# Patient Record
Sex: Male | Born: 1980 | Race: Black or African American | Hispanic: No | Marital: Married | Smoking: Former smoker
Health system: Southern US, Community
[De-identification: ages and names within clinical notes are randomized; demographics above are authoritative.]

---

## 2004-10-01 ENCOUNTER — Ambulatory Visit: Payer: Self-pay | Admitting: Internal Medicine

## 2004-10-02 ENCOUNTER — Ambulatory Visit: Payer: Self-pay

## 2004-10-15 ENCOUNTER — Ambulatory Visit: Payer: Self-pay | Admitting: Internal Medicine

## 2005-08-12 ENCOUNTER — Ambulatory Visit: Payer: Self-pay | Admitting: Internal Medicine

## 2005-12-10 ENCOUNTER — Ambulatory Visit: Payer: Self-pay | Admitting: Internal Medicine

## 2005-12-30 ENCOUNTER — Ambulatory Visit: Payer: Self-pay | Admitting: Internal Medicine

## 2005-12-31 ENCOUNTER — Ambulatory Visit: Payer: Self-pay

## 2005-12-31 ENCOUNTER — Encounter: Payer: Self-pay | Admitting: Cardiology

## 2006-01-05 ENCOUNTER — Emergency Department (HOSPITAL_COMMUNITY): Admission: EM | Admit: 2006-01-05 | Discharge: 2006-01-06 | Payer: Self-pay | Admitting: Emergency Medicine

## 2008-04-02 ENCOUNTER — Ambulatory Visit: Payer: Self-pay | Admitting: Internal Medicine

## 2008-04-02 LAB — CONVERTED CEMR LAB
AST: 26 units/L (ref 0–37)
Basophils Absolute: 0 10*3/uL (ref 0.0–0.1)
CO2: 32 meq/L (ref 19–32)
Calcium: 9.4 mg/dL (ref 8.4–10.5)
Chloride: 101 meq/L (ref 96–112)
Cholesterol: 193 mg/dL (ref 0–200)
Creatinine, Ser: 1.4 mg/dL (ref 0.4–1.5)
Eosinophils Absolute: 0.1 10*3/uL (ref 0.0–0.7)
GFR calc Af Amer: 78 mL/min
GFR calc non Af Amer: 65 mL/min
HDL: 44.6 mg/dL (ref >39.0)
MCHC: 34.5 g/dL (ref 30.0–36.0)
Monocytes Absolute: 0.4 10*3/uL (ref 0.1–1.0)
Potassium: 4.4 meq/L (ref 3.5–5.1)
RBC: 5.54 M/uL (ref 4.22–5.81)
TSH: 2.05 microintl units/mL (ref 0.35–5.50)
Total Bilirubin: 1 mg/dL (ref 0.3–1.2)
Total Protein: 6.8 g/dL (ref 6.0–8.3)
Triglycerides: 88 mg/dL (ref 0–149)
VLDL: 18 mg/dL (ref 0–40)
WBC: 4.8 10*3/uL (ref 4.5–10.5)

## 2008-04-09 ENCOUNTER — Ambulatory Visit: Payer: Self-pay | Admitting: Internal Medicine

## 2008-04-10 DIAGNOSIS — R030 Elevated blood-pressure reading, without diagnosis of hypertension: Secondary | ICD-10-CM | POA: Insufficient documentation

## 2011-01-29 ENCOUNTER — Encounter: Payer: Self-pay | Admitting: Internal Medicine

## 2011-01-29 ENCOUNTER — Ambulatory Visit (INDEPENDENT_AMBULATORY_CARE_PROVIDER_SITE_OTHER): Payer: 59 | Admitting: Internal Medicine

## 2011-01-29 DIAGNOSIS — Z0000 Encounter for general adult medical examination without abnormal findings: Secondary | ICD-10-CM

## 2011-02-05 NOTE — Assessment & Plan Note (Signed)
Summary: TO RE EST/[facility]/SS   Vital Signs:  Patient profile:   30 year old male Height:      77.5 inches Weight:      205.50 pounds BMI:     24.14 O2 Sat:      96 % on Room air Temp:     98.2 degrees F oral Pulse rate:   83 / minute Resp:     16 per minute BP sitting:   128 / 70  (right arm) Cuff size:   large  Vitals Entered By: Glendell Docker CMA (January 29, 2011 1:34 PM)  O2 Flow:  Room air CC: follow-up visit Is Patient Diabetic? No Pain Assessment Patient in pain? no      Comments no concerns   Primary Care Provider:  Dondra Spry DO  CC:  follow-up visit.  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  he stays fairly active lift weights and run 2-3 x per week (2-3 miles) no sob or chest pain He has some difficulty in gaining weight  prehypertension pt has been intermittently monitoring BP at local pharm Self-reported readings range from high 120s to low 130s systolic  his diet is fairly healthy He drinks one cup of coffee per day  Preventive Screening-Counseling & Management  Alcohol-Tobacco     Alcohol drinks/day: 0     Smoking Status: never  Caffeine-Diet-Exercise     Caffeine use/day: 1 beverage daily     Does Patient Exercise: yes     Times/week: 4  Allergies (verified): No Known Drug Allergies  Past History:  Past Medical History: History of atypical chest pain Prehypertension  Normal EF 55-60% 2/07  Family History: No family history of premature CAD parents are a & w no colon ca MGF - CAD late in life no prostate cancer  Social History: Married   (wife Software engineer)- 24 year old son daughter 8 weeks 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   Head:  normocephalic and atraumatic.   Eyes:  pupils equal, pupils round, and pupils reactive to light.   Ears:  R ear normal and L ear normal.   Mouth:  good dentition and pharynx pink and moist.   Neck:  No deformities, masses, or tenderness noted. Lungs:  normal respiratory effort, normal breath sounds, no crackles, and no wheezes.   Heart:  normal rate, regular rhythm, no murmur, and no gallop.   Abdomen:  soft, non-tender, normal bowel sounds, no masses, no hepatomegaly, and no splenomegaly.   Pulses:  dorsalis pedis and posterior tibial pulses are full and equal bilaterally Extremities:  No lower extremity edema  Neurologic:  cranial nerves II-XII intact and gait normal.   Psych:  normally interactive, good eye contact, not anxious appearing, and not depressed appearing.     Impression & Recommendations:  Problem # 1:  HEALTH MAINTENANCE EXAM (ICD-V70.0)  Reviewed adult health maintenance protocols. patient  advised to continue regular exercise Continue self-monitoring blood pressure Continue self testicular exam Repeat fasting lipid profile, BMET reviewed healthy eating habits Avoid foods containing refined sugars   Td Booster: Historical (02/14/2008)   Chol: 193 (04/02/2008)   HDL: 44.6 (04/02/2008)   LDL: 131 (04/02/2008)   TG: 88 (04/02/2008) TSH: 2.05 (04/02/2008)     Orders: T-Basic Metabolic Panel [phone]) T-Lipid Profile [id number])  Patient Instructions: 1)  Please schedule a follow-up appointment as needed.   Orders Added: 1)  T-Basic Metabolic Panel [[id number]] 2)  T-Lipid Profile [[id number]] 3)  Est.  Patient 18-39 years [[id number]]   Immunization History:  Tetanus/Td Immunization History:    Tetanus/Td:  historical (02/14/2008)   Immunization History:  Tetanus/Td Immunization History:    Tetanus/Td:  Historical (02/14/2008)   Current Allergies (reviewed today): No known allergies

## 2015-03-13 ENCOUNTER — Encounter (HOSPITAL_COMMUNITY): Payer: Self-pay | Admitting: Emergency Medicine

## 2015-03-13 ENCOUNTER — Emergency Department (INDEPENDENT_AMBULATORY_CARE_PROVIDER_SITE_OTHER)
Admission: EM | Admit: 2015-03-13 | Discharge: 2015-03-13 | Disposition: A | Discharge status: Home / Self Care | Payer: Worker's Compensation | Source: Home / Self Care | Attending: Emergency Medicine | Admitting: Emergency Medicine

## 2015-03-13 DIAGNOSIS — T148 Other injury of unspecified body region: Secondary | ICD-10-CM

## 2015-03-13 DIAGNOSIS — W5581XA Bitten by other mammals, initial encounter: Secondary | ICD-10-CM

## 2015-03-13 DIAGNOSIS — S61412A Laceration without foreign body of left hand, initial encounter: Secondary | ICD-10-CM

## 2015-03-13 DIAGNOSIS — T148XXA Other injury of unspecified body region, initial encounter: Secondary | ICD-10-CM

## 2015-03-13 MED ORDER — AMOXICILLIN-POT CLAVULANATE 875-125 MG PO TABS: 1.0000 | ORAL_TABLET | Freq: Two times a day (BID) | ORAL | Status: DC

## 2015-03-13 NOTE — Discharge Instructions (Signed)
Laceration Care, Adult °A laceration is a cut that goes through all layers of the skin. The cut goes into the tissue beneath the skin. °HOME CARE °For skin adhesive strips: °· Keep the cut clean and dry. °· Do not get the strips wet. You may take a bath, but be careful to keep the cut dry. °· If the cut gets wet, pat it dry with a clean towel. °· The strips will fall off on their own. Do not remove the strips that are still stuck to the cut. °You may need a tetanus shot if: °· You cannot remember when you had your last tetanus shot. °· You have never had a tetanus shot. °If you need a tetanus shot and you choose not to have one, you may get tetanus. Sickness from tetanus can be serious. °GET HELP RIGHT AWAY IF:  °· Your pain does not get better with medicine. °· Your arm, hand, leg, or foot loses feeling (numbness) or changes color. °· Your cut is bleeding. °· Your joint feels weak, or you cannot use your joint. °· You have painful lumps on your body. °· Your cut is red, puffy (swollen), or painful. °· You have a red line on the skin near the cut. °· You have yellowish-white fluid (pus) coming from the cut. °· You have a fever. °· You have a bad smell coming from the cut or bandage. °· Your cut breaks open before or after stitches are removed. °· You notice something coming out of the cut, such as wood or glass. °· You cannot move a finger or toe. °MAKE SURE YOU:  °· Understand these instructions. °· Will watch your condition. °· Will get help right away if you are not doing well or get worse. °Document Released: 05/04/2008 Document Revised: 02/08/2012 Document Reviewed: 05/12/2011 °ExitCare® Patient Information ©2015 [employer]. This information is not intended to replace advice given to you by your health care provider. Make sure you discuss any questions you have with your health care provider. ° °

## 2015-03-13 NOTE — ED Provider Notes (Signed)
CSN: [id number]     Arrival date & time 03/13/15  1853 History   First MD Initiated Contact with Patient 03/13/15 1944     Chief Complaint  Patient presents with  . Animal Bite   (Consider location/radiation/quality/duration/timing/severity/associated sxs/prior Treatment) HPI He is a 34 year old man here for evaluation of animal bite. He works for [place] [employer] and was training and new dog. The dog accidentally bit him on his left hand in the first web space. He immediately washed it out with hydrogen peroxide. He has washed it several times today with soap and water. The bite occurred around noon. He denies any pain. The dog is up-to-date on its immunizations.  History reviewed. No pertinent past medical history. History reviewed. No pertinent past surgical history. History reviewed. No pertinent family history. History  Substance Use Topics  . Smoking status: Never Smoker   . Smokeless tobacco: Never Used  . Alcohol Use: No    Review of Systems As in history of present illness Allergies  Iodine  Home Medications   Prior to Admission medications   Medication Sig Start Date End Date Taking? Authorizing Provider  amoxicillin-clavulanate (AUGMENTIN) 875-125 MG per tablet Take 1 tablet by mouth 2 (two) times daily. 03/13/15   Charm RingsErin J Jguadalupe Opiela, MD   BP 114/62 mmHg  Pulse 60  Temp(Src) 97.9 F (36.6 C) (Oral)  Resp 18  SpO2 99% Physical Exam  Constitutional: He is oriented to person, place, and time. He appears well-developed and well-nourished. No distress.  Cardiovascular: Normal rate.   Pulmonary/Chest: Effort normal.  Neurological: He is alert and oriented to person, place, and time.  Skin:  Skin flap type laceration to left first web space.    ED Course  LACERATION REPAIR Date/Time: 03/13/2015 8:42 PM Performed by: Charm RingsHONIG, Kameka Whan J Authorized by: Charm RingsHONIG, Jansel Vonstein J Consent: Verbal consent obtained. Risks and benefits: risks, benefits and alternatives were  discussed Consent given by: patient Patient understanding: patient states understanding of the procedure being performed Patient identity confirmed: verbally with patient Time out: Immediately prior to procedure a "time out" was called to verify the correct patient, procedure, equipment, support staff and site/side marked as required. Body area: upper extremity Location details: left hand Laceration length: 1 cm Tendon involvement: none Nerve involvement: none Vascular damage: no Wound skin closure material used: Steri-Strips. Approximation: loose Approximation difficulty: simple Dressing: 4x4 sterile gauze Patient tolerance: Patient tolerated the procedure well with no immediate complications Comments: Patient placed in thumb spica splint to prevent wound from reopening.   (including critical care time) Labs Review Labs Reviewed - No data to display  Imaging Review No results found.   MDM   1. Laceration of hand, left, initial encounter   2. Animal bite    Laceration loosely approximated with Steri-Strips. Thumb spica splint placed to prevent wound from reopening. Given that this is an animal bite, will treat with Augmentin. Follow-up as needed.    Charm RingsErin J Dominie Benedick, MD 03/13/15 2043

## 2015-03-13 NOTE — ED Notes (Signed)
Pt was training a dog at work when the dog accidentally bit him on the hand.  The hand was cleaned with hydrogen peroxide and has been flushed with water several times throughout the day.  Pt states the dog is fully immunized and he is not having any real discomfort from the bite.

## 2017-02-23 ENCOUNTER — Emergency Department (HOSPITAL_BASED_OUTPATIENT_CLINIC_OR_DEPARTMENT_OTHER)
Admission: EM | Admit: 2017-02-23 | Discharge: 2017-02-23 | Disposition: A | Discharge status: Home / Self Care | Payer: Worker's Compensation | Attending: Physician Assistant | Admitting: Physician Assistant

## 2017-02-23 ENCOUNTER — Encounter (HOSPITAL_BASED_OUTPATIENT_CLINIC_OR_DEPARTMENT_OTHER): Payer: Self-pay

## 2017-02-23 DIAGNOSIS — Z0000 Encounter for general adult medical examination without abnormal findings: Secondary | ICD-10-CM | POA: Diagnosis not present

## 2017-02-23 DIAGNOSIS — Z79899 Other long term (current) drug therapy: Secondary | ICD-10-CM | POA: Diagnosis not present

## 2017-02-23 LAB — ETHANOL: Alcohol, Ethyl (B): <5 mg/dL (ref <5)

## 2017-02-23 LAB — RAPID URINE DRUG SCREEN, HOSP PERFORMED
AMPHETAMINES: NOT DETECTED
Barbiturates: NOT DETECTED
Benzodiazepines: NOT DETECTED
Cocaine: NOT DETECTED
Opiates: NOT DETECTED
TETRAHYDROCANNABINOL: NOT DETECTED

## 2017-02-23 NOTE — ED Notes (Signed)
No acute distress noted.  Pt alert, calm and waiting in room.  Visitor at bedside.
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
## 2017-02-23 NOTE — Discharge Instructions (Signed)
You appeared very well today. We have given you information on  post traumatic Stress disorder. You may want to discuss today's event with Psychologist or psychiatrist as well as with your support system.

## 2018-02-16 DIAGNOSIS — Z0000 Encounter for general adult medical examination without abnormal findings: Secondary | ICD-10-CM | POA: Diagnosis not present

## 2018-02-16 DIAGNOSIS — Z131 Encounter for screening for diabetes mellitus: Secondary | ICD-10-CM | POA: Diagnosis not present

## 2018-02-16 DIAGNOSIS — Z13220 Encounter for screening for lipoid disorders: Secondary | ICD-10-CM | POA: Diagnosis not present

## 2019-04-26 ENCOUNTER — Encounter (HOSPITAL_COMMUNITY): Payer: Self-pay

## 2019-04-26 ENCOUNTER — Other Ambulatory Visit: Payer: Self-pay

## 2019-04-26 ENCOUNTER — Emergency Department (HOSPITAL_COMMUNITY)
Admission: EM | Admit: 2019-04-26 | Discharge: 2019-04-26 | Disposition: A | Discharge status: Home / Self Care | Payer: No Typology Code available for payment source | Attending: Emergency Medicine | Admitting: Emergency Medicine

## 2019-04-26 DIAGNOSIS — Y999 Unspecified external cause status: Secondary | ICD-10-CM | POA: Diagnosis not present

## 2019-04-26 DIAGNOSIS — S0101XA Laceration without foreign body of scalp, initial encounter: Secondary | ICD-10-CM

## 2019-04-26 DIAGNOSIS — Y92410 Unspecified street and highway as the place of occurrence of the external cause: Secondary | ICD-10-CM | POA: Diagnosis not present

## 2019-04-26 DIAGNOSIS — S70311A Abrasion, right thigh, initial encounter: Secondary | ICD-10-CM | POA: Diagnosis not present

## 2019-04-26 DIAGNOSIS — Y9389 Activity, other specified: Secondary | ICD-10-CM | POA: Insufficient documentation

## 2019-04-26 DIAGNOSIS — S0990XA Unspecified injury of head, initial encounter: Secondary | ICD-10-CM | POA: Diagnosis present

## 2019-04-26 MED ORDER — IBUPROFEN 800 MG PO TABS: 800.0000 mg | ORAL_TABLET | Freq: Three times a day (TID) | ORAL | 0 refills | Status: DC
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
## 2020-04-26 ENCOUNTER — Ambulatory Visit: Payer: 59 | Admitting: Family Medicine

## 2020-04-26 ENCOUNTER — Other Ambulatory Visit: Payer: Self-pay

## 2020-04-26 ENCOUNTER — Encounter: Payer: Self-pay | Admitting: Family Medicine

## 2020-04-26 VITALS — BP 98/68 | HR 69 | Temp 98.0°F | Ht 78.0 in | Wt 208.5 lb

## 2020-04-26 DIAGNOSIS — Z0000 Encounter for general adult medical examination without abnormal findings: Secondary | ICD-10-CM

## 2020-04-26 LAB — CBC WITH DIFFERENTIAL/PLATELET
Basophils Absolute: 0 10*3/uL (ref 0.0–0.1)
Basophils Relative: 0.4 % (ref 0.0–3.0)
Eosinophils Absolute: 0 10*3/uL (ref 0.0–0.7)
Eosinophils Relative: 0.8 % (ref 0.0–5.0)
HCT: 46.3 % (ref 39.0–52.0)
Hemoglobin: 15.4 g/dL (ref 13.0–17.0)
Lymphocytes Relative: 36.6 % (ref 12.0–46.0)
Lymphs Abs: 1.9 10*3/uL (ref 0.7–4.0)
MCHC: 33.3 g/dL (ref 30.0–36.0)
MCV: 85.1 fl (ref 78.0–100.0)
Monocytes Absolute: 0.5 10*3/uL (ref 0.1–1.0)
Monocytes Relative: 9 % (ref 3.0–12.0)
Neutro Abs: 2.7 10*3/uL (ref 1.4–7.7)
Neutrophils Relative %: 53.2 % (ref 43.0–77.0)
Platelets: 189 10*3/uL (ref 150.0–400.0)
RBC: 5.44 Mil/uL (ref 4.22–5.81)
RDW: 13.6 % (ref 11.5–15.5)
WBC: 5.1 10*3/uL (ref 4.0–10.5)

## 2020-04-26 LAB — HEPATIC FUNCTION PANEL
ALT: 16 U/L (ref 0–53)
AST: 18 U/L (ref 0–37)
Albumin: 5.1 g/dL (ref 3.5–5.2)
Alkaline Phosphatase: 56 U/L (ref 39–117)
Bilirubin, Direct: 0.1 mg/dL (ref 0.0–0.3)
Total Bilirubin: 0.7 mg/dL (ref 0.2–1.2)
Total Protein: 7.3 g/dL (ref 6.0–8.3)

## 2020-04-26 LAB — BASIC METABOLIC PANEL
BUN: 15 mg/dL (ref 6–23)
CO2: 31 mEq/L (ref 19–32)
Calcium: 9.8 mg/dL (ref 8.4–10.5)
Chloride: 101 mEq/L (ref 96–112)
Creatinine, Ser: 1.05 mg/dL (ref 0.40–1.50)
GFR: 95 mL/min (ref >60.00)
Glucose, Bld: 87 mg/dL (ref 70–99)
Potassium: 4.2 mEq/L (ref 3.5–5.1)
Sodium: 137 mEq/L (ref 135–145)

## 2020-04-26 LAB — LIPID PANEL
Cholesterol: 181 mg/dL (ref 0–200)
HDL: 53.7 mg/dL (ref >39.00)
LDL Cholesterol: 113 mg/dL — ABNORMAL HIGH (ref 0–99)
NonHDL: 126.94
Total CHOL/HDL Ratio: 3
Triglycerides: 72 mg/dL (ref 0.0–149.0)
VLDL: 14.4 mg/dL (ref 0.0–40.0)

## 2020-04-26 LAB — PSA: PSA: 0.37 ng/mL (ref 0.10–4.00)

## 2020-04-26 LAB — TSH: TSH: 1.57 u[IU]/mL (ref 0.35–4.50)

## 2020-04-26 NOTE — Patient Instructions (Signed)
Preventive Care 19-39 Years Old, Male Preventive care refers to lifestyle choices and visits with your health care provider that can promote health and wellness. This includes:  A yearly physical exam. This is also called an annual well check.  Regular dental and eye exams.  Immunizations.  Screening for certain conditions.  Healthy lifestyle choices, such as eating a healthy diet, getting regular exercise, not using drugs or products that contain nicotine and tobacco, and limiting alcohol use. What can I expect for my preventive care visit? Physical exam Your health care provider will check:  Height and weight. These may be used to calculate body mass index (BMI), which is a measurement that tells if you are at a healthy weight.  Heart rate and blood pressure.  Your skin for abnormal spots. Counseling Your health care provider may ask you questions about:  Alcohol, tobacco, and drug use.  Emotional well-being.  Home and relationship well-being.  Sexual activity.  Eating habits.  Work and work Statistician. What immunizations do I need?  Influenza (flu) vaccine  This is recommended every year. Tetanus, diphtheria, and pertussis (Tdap) vaccine  You may need a Td booster every 10 years. Varicella (chickenpox) vaccine  You may need this vaccine if you have not already been vaccinated. Human papillomavirus (HPV) vaccine  If recommended by your health care provider, you may need three doses over 6 months. Measles, mumps, and rubella (MMR) vaccine  You may need at least one dose of MMR. You may also need a second dose. Meningococcal conjugate (MenACWY) vaccine  One dose is recommended if you are 45-76 years old and a Market researcher living in a residence hall, or if you have one of several medical conditions. You may also need additional booster doses. Pneumococcal conjugate (PCV13) vaccine  You may need this if you have certain conditions and were not  previously vaccinated. Pneumococcal polysaccharide (PPSV23) vaccine  You may need one or two doses if you smoke cigarettes or if you have certain conditions. Hepatitis A vaccine  You may need this if you have certain conditions or if you travel or work in places where you may be exposed to hepatitis A. Hepatitis B vaccine  You may need this if you have certain conditions or if you travel or work in places where you may be exposed to hepatitis B. Haemophilus influenzae type b (Hib) vaccine  You may need this if you have certain risk factors. You may receive vaccines as individual doses or as more than one vaccine together in one shot (combination vaccines). Talk with your health care provider about the risks and benefits of combination vaccines. What tests do I need? Blood tests  Lipid and cholesterol levels. These may be checked every 5 years starting at age 17.  Hepatitis C test.  Hepatitis B test. Screening   Diabetes screening. This is done by checking your blood sugar (glucose) after you have not eaten for a while (fasting).  Sexually transmitted disease (STD) testing. Talk with your health care provider about your test results, treatment options, and if necessary, the need for more tests. Follow these instructions at home: Eating and drinking   Eat a diet that includes fresh fruits and vegetables, whole grains, lean protein, and low-fat dairy products.  Take vitamin and mineral supplements as recommended by your health care provider.  Do not drink alcohol if your health care provider tells you not to drink.  If you drink alcohol: ? Limit how much you have to 0-2  drinks a day. ? Be aware of how much alcohol is in your drink. In the [place] one drink equals one 12 oz bottle of beer (355 mL), one 5 oz glass of wine (148 mL), or one 1 oz glass of hard liquor (44 mL). Lifestyle  Take daily care of your teeth and gums.  Stay active. Exercise for at least 30 minutes on 5 or  more days each week.  Do not use any products that contain nicotine or tobacco, such as cigarettes, e-cigarettes, and chewing tobacco. If you need help quitting, ask your health care provider.  If you are sexually active, practice safe sex. Use a condom or other form of protection to prevent STIs (sexually transmitted infections). What's next?  Go to your health care provider once a year for a well check visit.  Ask your health care provider how often you should have your eyes and teeth checked.  Stay up to date on all vaccines. This information is not intended to replace advice given to you by your health care provider. Make sure you discuss any questions you have with your health care provider. Document Revised: 11/10/2018 Document Reviewed: 11/10/2018 Elsevier Patient Education  2020 [employer].

## 2020-04-26 NOTE — Progress Notes (Signed)
Subjective:     Patient ID: Charles Mcknight, male   DOB: [birth year], 39 y.o.   MRN: [id number]  HPI Charles Mcknight is seen to establish care.  He is requesting physical exam.   He is married and has 4 children.  He has 3 of his own and is also helping to raise 21 year old nephew.  He grew up in [place] in [place].  He has several siblings and several of these are half siblings.  He has been married for 14 years.  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  Non-smoker.  Only occasional alcohol use.  No chronic medical problems.  Takes no medications.  No prior surgeries.  Last tetanus 2015.  Family history is significant for mother and father having history of alcohol and drug abuse.  They are both deceased.  He stays very active with martial arts.  Exercises fairly regularly.  History reviewed. No pertinent past medical history. History reviewed. No pertinent surgical history.  reports that he has been smoking cigars. He has never used smokeless tobacco. He reports that he does not drink alcohol or use drugs. family history includes Alcohol abuse in his father and mother; Drug abuse in his father and mother; Early death in his father and mother. Allergies  Allergen Reactions  . Iodine      Review of Systems  Constitutional: Negative for activity change, appetite change, fatigue and fever.  HENT: Negative for congestion, ear pain and trouble swallowing.   Eyes: Negative for pain and visual disturbance.  Respiratory: Negative for cough, shortness of breath and wheezing.   Cardiovascular: Negative for chest pain and palpitations.  Gastrointestinal: Negative for abdominal distention, abdominal pain, blood in stool, constipation, diarrhea, nausea, rectal pain and vomiting.  Genitourinary: Negative for dysuria, hematuria and testicular pain.  Musculoskeletal: Negative for arthralgias and joint swelling.  Skin: Negative for rash.  Neurological: Negative for dizziness, syncope and headaches.   Hematological: Negative for adenopathy.  Psychiatric/Behavioral: Negative for confusion and dysphoric mood.       Objective:   Physical Exam Constitutional:      General: He is not in acute distress.    Appearance: He is well-developed.  HENT:     Head: Normocephalic and atraumatic.     Right Ear: External ear normal.     Left Ear: External ear normal.  Eyes:     Conjunctiva/sclera: Conjunctivae normal.     Pupils: Pupils are equal, round, and reactive to light.  Neck:     Thyroid: No thyromegaly.  Cardiovascular:     Rate and Rhythm: Normal rate and regular rhythm.     Heart sounds: Normal heart sounds. No murmur.  Pulmonary:     Effort: No respiratory distress.     Breath sounds: No wheezing or rales.  Abdominal:     General: Bowel sounds are normal. There is no distension.     Palpations: Abdomen is soft. There is no mass.     Tenderness: There is no abdominal tenderness. There is no guarding or rebound.  Musculoskeletal:     Cervical back: Normal range of motion and neck supple.     Right lower leg: No edema.     Left lower leg: No edema.  Lymphadenopathy:     Cervical: No cervical adenopathy.  Skin:    Findings: No rash.  Neurological:     Mental Status: He is alert and oriented to person, place, and time.     Cranial Nerves: No cranial nerve  deficit.        Assessment:     Healthy 39 year old male.  No recent screening labs.  Tetanus up-to-date.  Has received Covid vaccines already.    Plan:     -Obtain screening labs.  He is requesting PSA. -Continue regular exercise habits.  Eulas Post MD [facility] at [facility]

## 2021-12-17 ENCOUNTER — Ambulatory Visit (INDEPENDENT_AMBULATORY_CARE_PROVIDER_SITE_OTHER): Payer: 59 | Admitting: Family Medicine

## 2021-12-17 VITALS — BP 130/70 | HR 69 | Temp 98.2°F | Ht 78.0 in | Wt 194.3 lb

## 2021-12-17 DIAGNOSIS — Z0000 Encounter for general adult medical examination without abnormal findings: Secondary | ICD-10-CM

## 2021-12-17 DIAGNOSIS — Z23 Encounter for immunization: Secondary | ICD-10-CM

## 2021-12-17 LAB — LIPID PANEL
Cholesterol: 192 mg/dL (ref 0–200)
HDL: 67.3 mg/dL (ref >39.00)
LDL Cholesterol: 112 mg/dL — ABNORMAL HIGH (ref 0–99)
NonHDL: 124.35
Total CHOL/HDL Ratio: 3
Triglycerides: 62 mg/dL (ref 0.0–149.0)
VLDL: 12.4 mg/dL (ref 0.0–40.0)

## 2021-12-17 LAB — HEPATIC FUNCTION PANEL
ALT: 16 U/L (ref 0–53)
AST: 19 U/L (ref 0–37)
Albumin: 5 g/dL (ref 3.5–5.2)
Alkaline Phosphatase: 57 U/L (ref 39–117)
Bilirubin, Direct: 0.2 mg/dL (ref 0.0–0.3)
Total Bilirubin: 0.8 mg/dL (ref 0.2–1.2)
Total Protein: 7.3 g/dL (ref 6.0–8.3)

## 2021-12-17 LAB — CBC WITH DIFFERENTIAL/PLATELET
Basophils Absolute: 0 10*3/uL (ref 0.0–0.1)
Basophils Relative: 0.6 % (ref 0.0–3.0)
Eosinophils Absolute: 0.1 10*3/uL (ref 0.0–0.7)
Eosinophils Relative: 1.1 % (ref 0.0–5.0)
HCT: 47.4 % (ref 39.0–52.0)
Hemoglobin: 15.5 g/dL (ref 13.0–17.0)
Lymphocytes Relative: 35.2 % (ref 12.0–46.0)
Lymphs Abs: 2 10*3/uL (ref 0.7–4.0)
MCHC: 32.7 g/dL (ref 30.0–36.0)
MCV: 85.2 fl (ref 78.0–100.0)
Monocytes Absolute: 0.5 10*3/uL (ref 0.1–1.0)
Monocytes Relative: 8.5 % (ref 3.0–12.0)
Neutro Abs: 3.2 10*3/uL (ref 1.4–7.7)
Neutrophils Relative %: 54.6 % (ref 43.0–77.0)
Platelets: 209 10*3/uL (ref 150.0–400.0)
RBC: 5.56 Mil/uL (ref 4.22–5.81)
RDW: 13.5 % (ref 11.5–15.5)
WBC: 5.8 10*3/uL (ref 4.0–10.5)

## 2021-12-17 LAB — BASIC METABOLIC PANEL
BUN: 15 mg/dL (ref 6–23)
CO2: 31 mEq/L (ref 19–32)
Calcium: 9.8 mg/dL (ref 8.4–10.5)
Chloride: 100 mEq/L (ref 96–112)
Creatinine, Ser: 1.16 mg/dL (ref 0.40–1.50)
GFR: 78.55 mL/min (ref >60.00)
Glucose, Bld: 85 mg/dL (ref 70–99)
Potassium: 4.8 mEq/L (ref 3.5–5.1)
Sodium: 138 mEq/L (ref 135–145)

## 2021-12-17 LAB — PSA: PSA: 0.45 ng/mL (ref 0.10–4.00)

## 2021-12-17 LAB — TSH: TSH: 2.08 u[IU]/mL (ref 0.35–5.50)

## 2021-12-17 NOTE — Progress Notes (Signed)
Established Patient Office Visit  Subjective:  Patient ID: Charles Mcknight, male    DOB: May 19, 1981  Age: 41 y.o. MRN: [id number]  CC:  Chief Complaint  Patient presents with   Annual Exam    HPI Charles Mcknight presents for physical exam.  Very health-conscious.  He is exercising regularly.  He ran his first half marathon back in the fall.  Has run as much as 60 miles per week.  No regular medications.  Tries to watch diet carefully.  Health maintenance  -Has not had flu vaccine but would like to go ahead with that today -No history of hepatitis C screening but low risk -Tetanus up-to-date  Social history-he is married.  He has 3 children of his own helping raise nephew who is 15.  Patient grew up in [place].  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  Never smoked.  Rare alcohol.  Family history-both parents had alcohol and drug problems.  They are both deceased.  His father had HIV.  Not sure of their exact cause of death.  Mother he states had multiorgan failure.  No past medical history on file.  No past surgical history on file.  Family History  Problem Relation Age of Onset   Drug abuse Mother    Alcohol abuse Mother    Early death Mother    Alcohol abuse Father    Drug abuse Father    Early death Father     Social History   Socioeconomic History   Marital status: Married    Spouse name: Not on file   Number of children: Not on file   Years of education: Not on file   Highest education level: Not on file  Occupational History   Not on file  Tobacco Use   Smoking status: Some Days    Types: Cigars   Smokeless tobacco: Never   Tobacco comments:    cigar socially  Vaping Use   Vaping Use: Never used  Substance and Sexual Activity   Alcohol use: No   Drug use: No   Sexual activity: Yes  Other Topics Concern   Not on file  Social History Narrative   Not on file   Social Determinants of Health   Financial Resource Strain: Not on file  Food  Insecurity: Not on file  Transportation Needs: Not on file  Physical Activity: Not on file  Stress: Not on file  Social Connections: Not on file  Intimate Partner Violence: Not on file    No outpatient medications prior to visit.   No facility-administered medications prior to visit.    Allergies  Allergen Reactions   Iodine     ROS Review of Systems  Constitutional:  Negative for activity change, appetite change, fatigue and fever.  HENT:  Negative for congestion, ear pain and trouble swallowing.   Eyes:  Negative for pain and visual disturbance.  Respiratory:  Negative for cough, shortness of breath and wheezing.   Cardiovascular:  Negative for chest pain and palpitations.  Gastrointestinal:  Negative for abdominal distention, abdominal pain, blood in stool, constipation, diarrhea, nausea, rectal pain and vomiting.  Genitourinary:  Negative for dysuria, hematuria and testicular pain.  Musculoskeletal:  Negative for arthralgias and joint swelling.  Skin:  Negative for rash.  Neurological:  Negative for dizziness, syncope and headaches.  Hematological:  Negative for adenopathy.  Psychiatric/Behavioral:  Negative for confusion and dysphoric mood.      Objective:    Physical Exam Constitutional:  General: He is not in acute distress.    Appearance: He is well-developed.  HENT:     Head: Normocephalic and atraumatic.     Right Ear: External ear normal.     Left Ear: External ear normal.  Eyes:     Conjunctiva/sclera: Conjunctivae normal.     Pupils: Pupils are equal, round, and reactive to light.  Neck:     Thyroid: No thyromegaly.  Cardiovascular:     Rate and Rhythm: Normal rate and regular rhythm.     Heart sounds: Normal heart sounds. No murmur heard. Pulmonary:     Effort: No respiratory distress.     Breath sounds: No wheezing or rales.  Abdominal:     General: Bowel sounds are normal. There is no distension.     Palpations: Abdomen is soft. There is no  mass.     Tenderness: There is no abdominal tenderness. There is no guarding or rebound.  Musculoskeletal:     Cervical back: Normal range of motion and neck supple.     Right lower leg: No edema.     Left lower leg: No edema.  Lymphadenopathy:     Cervical: No cervical adenopathy.  Skin:    Findings: No rash.  Neurological:     Mental Status: He is alert and oriented to person, place, and time.     Cranial Nerves: No cranial nerve deficit.     Deep Tendon Reflexes: Reflexes normal.    BP 130/70 (BP Location: Left Arm, Patient Position: Sitting, Cuff Size: Normal)    Pulse 69    Temp 98.2 F (36.8 C) (Oral)    Ht 6\' 6"  (1.981 m)    Wt 194 lb 4.8 oz (88.1 kg)    SpO2 98%    BMI 22.45 kg/m  Wt Readings from Last 3 Encounters:  12/17/21 194 lb 4.8 oz (88.1 kg)  04/26/20 208 lb 8 oz (94.6 kg)  04/26/19 208 lb (94.3 kg)     Health Maintenance Due  Topic Date Due   HIV Screening  Never done   Hepatitis C Screening  Never done   COVID-19 Vaccine (3 - Booster for Pfizer series) 03/15/2020    There are no preventive care reminders to display for this patient.  Lab Results  Component Value Date   TSH 1.57 04/26/2020   Lab Results  Component Value Date   WBC 5.1 04/26/2020   HGB 15.4 04/26/2020   HCT 46.3 04/26/2020   MCV 85.1 04/26/2020   PLT 189.0 04/26/2020   Lab Results  Component Value Date   NA 137 04/26/2020   K 4.2 04/26/2020   CO2 31 04/26/2020   GLUCOSE 87 04/26/2020   BUN 15 04/26/2020   CREATININE 1.05 04/26/2020   BILITOT 0.7 04/26/2020   ALKPHOS 56 04/26/2020   AST 18 04/26/2020   ALT 16 04/26/2020   PROT 7.3 04/26/2020   ALBUMIN 5.1 04/26/2020   CALCIUM 9.8 04/26/2020   GFR 95.00 04/26/2020   Lab Results  Component Value Date   CHOL 181 04/26/2020   Lab Results  Component Value Date   HDL 53.70 04/26/2020   Lab Results  Component Value Date   LDLCALC 113 (H) 04/26/2020   Lab Results  Component Value Date   TRIG 72.0 04/26/2020   Lab  Results  Component Value Date   CHOLHDL 3 04/26/2020   No results found for: HGBA1C    Assessment & Plan:   Problem List Items Addressed This Visit  None Visit Diagnoses     Physical exam    -  Primary   Relevant Orders   Basic metabolic panel   Lipid panel   CBC with Differential/Platelet   TSH   Hepatic function panel   PSA   Hep C Antibody   Need for immunization against influenza       Relevant Orders   Flu Vaccine QUAD 28mo+IM (Fluarix, Fluzone & Alfiuria Quad PF) (Completed)     He has lost some weight recently which he attributes to increased running over the past year.  Feels well though with good appetite.  -Obtain screening labs as above.  Include hepatitis C antibody. -Flu vaccine given -Patient requesting PSA screening -Continue regular exercise habits  No orders of the defined types were placed in this encounter.   Follow-up: No follow-ups on file.    Carolann Littler, MD

## 2021-12-18 LAB — HEPATITIS C ANTIBODY
Hepatitis C Ab: NONREACTIVE
SIGNAL TO CUT-OFF: 0.07 (ref <1.00)

## 2022-03-24 ENCOUNTER — Emergency Department (HOSPITAL_BASED_OUTPATIENT_CLINIC_OR_DEPARTMENT_OTHER): Payer: No Typology Code available for payment source | Admitting: Radiology

## 2022-03-24 ENCOUNTER — Emergency Department (HOSPITAL_BASED_OUTPATIENT_CLINIC_OR_DEPARTMENT_OTHER)
Admission: EM | Admit: 2022-03-24 | Discharge: 2022-03-24 | Disposition: A | Discharge status: Home / Self Care | Payer: No Typology Code available for payment source | Attending: Emergency Medicine | Admitting: Emergency Medicine

## 2022-03-24 ENCOUNTER — Encounter (HOSPITAL_BASED_OUTPATIENT_CLINIC_OR_DEPARTMENT_OTHER): Payer: Self-pay

## 2022-03-24 ENCOUNTER — Other Ambulatory Visit: Payer: Self-pay

## 2022-03-24 DIAGNOSIS — S61401A Unspecified open wound of right hand, initial encounter: Secondary | ICD-10-CM | POA: Insufficient documentation

## 2022-03-24 DIAGNOSIS — S61402A Unspecified open wound of left hand, initial encounter: Secondary | ICD-10-CM | POA: Diagnosis not present

## 2022-03-24 DIAGNOSIS — S6991XA Unspecified injury of right wrist, hand and finger(s), initial encounter: Secondary | ICD-10-CM | POA: Diagnosis present

## 2022-03-24 DIAGNOSIS — Z21 Asymptomatic human immunodeficiency virus [HIV] infection status: Secondary | ICD-10-CM | POA: Insufficient documentation

## 2022-03-24 DIAGNOSIS — S61409A Unspecified open wound of unspecified hand, initial encounter: Secondary | ICD-10-CM

## 2022-03-24 LAB — COMPREHENSIVE METABOLIC PANEL
ALT: 13 U/L (ref 0–44)
AST: 22 U/L (ref 15–41)
Albumin: 4.7 g/dL (ref 3.5–5.0)
Alkaline Phosphatase: 51 U/L (ref 38–126)
Anion gap: 9 (ref 5–15)
BUN: 14 mg/dL (ref 6–20)
CO2: 27 mmol/L (ref 22–32)
Calcium: 10.1 mg/dL (ref 8.9–10.3)
Chloride: 101 mmol/L (ref 98–111)
Creatinine, Ser: 1.09 mg/dL (ref 0.61–1.24)
GFR, Estimated: >60 mL/min (ref >60)
Glucose, Bld: 95 mg/dL (ref 70–99)
Potassium: 3.8 mmol/L (ref 3.5–5.1)
Sodium: 137 mmol/L (ref 135–145)
Total Bilirubin: 0.6 mg/dL (ref 0.3–1.2)
Total Protein: 7.3 g/dL (ref 6.5–8.1)

## 2022-03-24 LAB — RAPID HIV SCREEN (HIV 1/2 AB+AG)
HIV 1/2 Antibodies: NONREACTIVE
HIV-1 P24 Antigen - HIV24: NONREACTIVE

## 2022-03-24 MED ORDER — ELVITEG-COBIC-EMTRICIT-TENOFAF 150-150-200-10 MG PREPACK
1.0000 | ORAL_TABLET | Freq: Once | ORAL | Status: AC
  Administered 2022-03-25: 1 via ORAL

## 2022-03-24 MED ORDER — ELVITEG-COBIC-EMTRICIT-TENOFAF 150-150-200-10 MG PO TABS: 1.0000 | ORAL_TABLET | Freq: Every day | ORAL | 0 refills | Status: DC

## 2022-03-24 MED ORDER — AMOXICILLIN-POT CLAVULANATE 875-125 MG PO TABS: 1.0000 | ORAL_TABLET | Freq: Two times a day (BID) | ORAL | 0 refills | Status: DC

## 2022-03-24 MED ORDER — AMOXICILLIN-POT CLAVULANATE 875-125 MG PO TABS
1.0000 | ORAL_TABLET | Freq: Once | ORAL | Status: AC
  Administered 2022-03-24: 1 via ORAL
  Filled 2022-03-24: qty 1
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
## 2022-03-24 NOTE — Discharge Instructions (Signed)
Follow up with your primary care provider.  I started you on a medication for postexposure prophylaxis. ? ?Return for any worsening symptoms. ?
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
## 2022-03-25 ENCOUNTER — Telehealth: Payer: Self-pay | Admitting: *Deleted

## 2022-03-25 ENCOUNTER — Other Ambulatory Visit (HOSPITAL_COMMUNITY): Payer: Self-pay

## 2022-03-25 LAB — HEPATITIS B SURFACE ANTIGEN: Hepatitis B Surface Ag: NONREACTIVE

## 2022-03-25 LAB — HEPATITIS C ANTIBODY: HCV Ab: NONREACTIVE

## 2022-03-25 NOTE — Telephone Encounter (Signed)
RNCM placed call to pt to enroll in Advancing Access program. No answer and voicemail was full. Pt is insured with $50 co-pay for Rx. ?

## 2022-03-25 NOTE — TOC Benefit Eligibility Note (Signed)
Patient Advocate Encounter ? ?Insurance verification completed.   ? ?The patient is currently admitted and upon discharge could be taking Genvoya 150-150-10 mg tablets. ? ?The current 30 day co-pay is, $50.00.  ? ?The patient is insured through [employer]  ? ? ? ?Uriah Philipson, CPhT ?Pharmacy Patient Advocate Specialist ?[facility] Patient Advocate Team ?Direct Number: [phone]  Fax: [phone] ? ? ? ? ? ?  ?

## 2022-03-25 NOTE — ED Notes (Signed)
Pt was given prescription for Genvoya to take to 24 hour pharmacy to have medication filled tonight so that he could take his first dose after discharge.  ?

## 2022-03-26 LAB — RPR: RPR Ser Ql: NONREACTIVE

## 2023-01-01 ENCOUNTER — Ambulatory Visit (INDEPENDENT_AMBULATORY_CARE_PROVIDER_SITE_OTHER): Payer: 59 | Admitting: Family Medicine

## 2023-01-01 ENCOUNTER — Encounter: Payer: Self-pay | Admitting: Family Medicine

## 2023-01-01 VITALS — BP 118/70 | HR 56 | Temp 98.4°F | Ht 76.38 in | Wt 203.0 lb

## 2023-01-01 DIAGNOSIS — Z0000 Encounter for general adult medical examination without abnormal findings: Secondary | ICD-10-CM

## 2023-01-01 LAB — BASIC METABOLIC PANEL
BUN: 14 mg/dL (ref 6–23)
CO2: 30 mEq/L (ref 19–32)
Calcium: 9.5 mg/dL (ref 8.4–10.5)
Chloride: 100 mEq/L (ref 96–112)
Creatinine, Ser: 1.26 mg/dL (ref 0.40–1.50)
GFR: 70.61 mL/min (ref >60.00)
Glucose, Bld: 90 mg/dL (ref 70–99)
Potassium: 4.9 mEq/L (ref 3.5–5.1)
Sodium: 138 mEq/L (ref 135–145)

## 2023-01-01 LAB — CBC WITH DIFFERENTIAL/PLATELET
Basophils Absolute: 0 10*3/uL (ref 0.0–0.1)
Basophils Relative: 0.5 % (ref 0.0–3.0)
Eosinophils Absolute: 0 10*3/uL (ref 0.0–0.7)
Eosinophils Relative: 0.8 % (ref 0.0–5.0)
HCT: 44.1 % (ref 39.0–52.0)
Hemoglobin: 14.8 g/dL (ref 13.0–17.0)
Lymphocytes Relative: 34 % (ref 12.0–46.0)
Lymphs Abs: 1.6 10*3/uL (ref 0.7–4.0)
MCHC: 33.7 g/dL (ref 30.0–36.0)
MCV: 85 fl (ref 78.0–100.0)
Monocytes Absolute: 0.5 10*3/uL (ref 0.1–1.0)
Monocytes Relative: 9.6 % (ref 3.0–12.0)
Neutro Abs: 2.7 10*3/uL (ref 1.4–7.7)
Neutrophils Relative %: 55.1 % (ref 43.0–77.0)
Platelets: 211 10*3/uL (ref 150.0–400.0)
RBC: 5.18 Mil/uL (ref 4.22–5.81)
RDW: 14 % (ref 11.5–15.5)
WBC: 4.8 10*3/uL (ref 4.0–10.5)

## 2023-01-01 LAB — PSA: PSA: 0.42 ng/mL (ref 0.10–4.00)

## 2023-01-01 LAB — HEPATIC FUNCTION PANEL
ALT: 22 U/L (ref 0–53)
AST: 29 U/L (ref 0–37)
Albumin: 4.7 g/dL (ref 3.5–5.2)
Alkaline Phosphatase: 62 U/L (ref 39–117)
Bilirubin, Direct: 0.1 mg/dL (ref 0.0–0.3)
Total Bilirubin: 0.6 mg/dL (ref 0.2–1.2)
Total Protein: 6.8 g/dL (ref 6.0–8.3)

## 2023-01-01 LAB — LIPID PANEL
Cholesterol: 193 mg/dL (ref 0–200)
HDL: 64.1 mg/dL (ref >39.00)
LDL Cholesterol: 115 mg/dL — ABNORMAL HIGH (ref 0–99)
NonHDL: 128.61
Total CHOL/HDL Ratio: 3
Triglycerides: 68 mg/dL (ref 0.0–149.0)
VLDL: 13.6 mg/dL (ref 0.0–40.0)

## 2023-01-01 LAB — TSH: TSH: 1.67 u[IU]/mL (ref 0.35–5.50)

## 2023-01-01 NOTE — Progress Notes (Signed)
Established Patient Office Visit  Subjective   Patient ID: Charles Mcknight, male    DOB: 1981-08-21  Age: 42 y.o. MRN: [id number]  Chief Complaint  Patient presents with   Annual Exam    HPI   [name] is seen for physical exam.  Generally doing well.  He is very health-conscious and exercises regularly.  Does combination of resistance training and running.  He has scaled his running back to about 20 miles per week.  Generally feels well.  Takes no medications.  No prior surgeries.  Health maintenance reviewed  -Declines flu vaccine -Tetanus up-to-date -prior hepatitis C screen negative  Social history-he is married.  He has 3 children of his own helping raise nephew who is 4.  Patient grew up in [place].  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  Never smoked.  Rare alcohol.  He and his wife are also taking care now all of a niece and nephew from [place].  They are ages 14 and 43.   Family history-both parents had alcohol and drug problems.  They are both deceased.  His father had HIV.  Not sure of their exact cause of death.  Mother he states had multiorgan failure.  He has 3 brothers and 2 sisters who are well with no major medical problems.  History reviewed. No pertinent past medical history. History reviewed. No pertinent surgical history.  reports that he has quit smoking. His smoking use included cigars. He has never used smokeless tobacco. He reports that he does not drink alcohol and does not use drugs. family history includes Alcohol abuse in his father and mother; Drug abuse in his father and mother; Early death in his father and mother. Allergies  Allergen Reactions   Iodine     Review of Systems  Constitutional:  Negative for chills, fever, malaise/fatigue and weight loss.  HENT:  Negative for hearing loss.   Eyes:  Negative for blurred vision and double vision.  Respiratory:  Negative for cough and shortness of breath.   Cardiovascular:  Negative for chest  pain, palpitations and leg swelling.  Gastrointestinal:  Negative for abdominal pain, blood in stool, constipation and diarrhea.  Genitourinary:  Negative for dysuria.  Skin:  Negative for rash.  Neurological:  Negative for dizziness, speech change, seizures, loss of consciousness and headaches.  Psychiatric/Behavioral:  Negative for depression.       Objective:     BP 118/70 (BP Location: Left Arm, Patient Position: Sitting, Cuff Size: Normal)   Pulse (!) 56   Temp 98.4 F (36.9 C) (Oral)   Ht 6' 4.38" (1.94 m)   Wt 203 lb (92.1 kg)   SpO2 99%   BMI 24.47 kg/m    Physical Exam Constitutional:      General: He is not in acute distress.    Appearance: He is well-developed.  HENT:     Head: Normocephalic and atraumatic.     Right Ear: External ear normal.     Left Ear: External ear normal.  Eyes:     Conjunctiva/sclera: Conjunctivae normal.     Pupils: Pupils are equal, round, and reactive to light.  Neck:     Thyroid: No thyromegaly.  Cardiovascular:     Rate and Rhythm: Normal rate and regular rhythm.     Heart sounds: Normal heart sounds. No murmur heard. Pulmonary:     Effort: No respiratory distress.     Breath sounds: No wheezing or rales.  Abdominal:  General: Bowel sounds are normal. There is no distension.     Palpations: Abdomen is soft. There is no mass.     Tenderness: There is no abdominal tenderness. There is no guarding or rebound.  Musculoskeletal:     Cervical back: Normal range of motion and neck supple.  Lymphadenopathy:     Cervical: No cervical adenopathy.  Skin:    Findings: No rash.  Neurological:     Mental Status: He is alert and oriented to person, place, and time.     Cranial Nerves: No cranial nerve deficit.      No results found for any visits on 01/01/23.    The 10-year ASCVD risk score (Arnett DK, et al., 2019) is: 2.6%    Assessment & Plan:   Problem List Items Addressed This Visit   None Visit Diagnoses      Physical exam    -  Primary   Relevant Orders   Lipid panel   Basic metabolic panel   CBC with Differential/Platelet   TSH   Hepatic function panel   PSA     -Offered flu vaccine but he declines -He had questions regarding colonoscopy screening.  No known family history.  Recommend age 17 for screening for low risk -Obtain screening labs as above -Continue regular exercise habits  No follow-ups on file.    Carolann Littler, MD

## 2023-03-25 ENCOUNTER — Ambulatory Visit: Payer: 59 | Admitting: Family Medicine

## 2023-03-26 ENCOUNTER — Ambulatory Visit (INDEPENDENT_AMBULATORY_CARE_PROVIDER_SITE_OTHER): Payer: 59

## 2023-03-26 ENCOUNTER — Encounter: Payer: Self-pay | Admitting: Family Medicine

## 2023-03-26 ENCOUNTER — Ambulatory Visit (INDEPENDENT_AMBULATORY_CARE_PROVIDER_SITE_OTHER): Payer: 59 | Admitting: Family Medicine

## 2023-03-26 VITALS — BP 118/78 | HR 68 | Temp 98.2°F | Wt 208.0 lb

## 2023-03-26 DIAGNOSIS — M5413 Radiculopathy, cervicothoracic region: Secondary | ICD-10-CM | POA: Insufficient documentation

## 2023-03-26 NOTE — Progress Notes (Signed)
   Subjective:    Patient ID: Charles Mcknight, male    DOB: 10/30/1981, 42 y.o.   MRN: [id number]  HPI Here for intermittent sharp pains in the right lower neck area that radiate down tht right arm to the hand. These started about a year ago. Now for the past 10 days he also has numbness and tingling down the right arm into the right 2nd and 3rd fingers. No weakness. He still works out and runs. He never treats this with anything. No hx of trauma.    Review of Systems  Constitutional: Negative.   Respiratory: Negative.    Cardiovascular: Negative.   Musculoskeletal:  Positive for neck pain.  Neurological:  Positive for numbness.       Objective:   Physical Exam Constitutional:      Appearance: Normal appearance.  Cardiovascular:     Rate and Rhythm: Normal rate and regular rhythm.     Pulses: Normal pulses.     Heart sounds: Normal heart sounds.  Pulmonary:     Effort: Pulmonary effort is normal.     Breath sounds: Normal breath sounds.  Musculoskeletal:     Comments: Mildly tender just to the right of the lower neck and the upper back areas. The neck has full ROM but full flexion is painful   Neurological:     Mental Status: He is alert.           Assessment & Plan:  Neck pain with radiculopathy. We will get Xrays of the cervical and thoracic spine areas today.  Gershon Crane, MD
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
## 2024-01-05 ENCOUNTER — Ambulatory Visit (INDEPENDENT_AMBULATORY_CARE_PROVIDER_SITE_OTHER): Payer: 59 | Admitting: Family Medicine

## 2024-01-05 ENCOUNTER — Encounter: Payer: Self-pay | Admitting: Family Medicine

## 2024-01-05 VITALS — BP 130/80 | HR 56 | Temp 98.1°F | Ht 77.17 in | Wt 201.0 lb

## 2024-01-05 DIAGNOSIS — Z0000 Encounter for general adult medical examination without abnormal findings: Secondary | ICD-10-CM | POA: Diagnosis not present

## 2024-01-05 LAB — CBC WITH DIFFERENTIAL/PLATELET
Basophils Absolute: 0 10*3/uL (ref 0.0–0.1)
Basophils Relative: 0.3 % (ref 0.0–3.0)
Eosinophils Absolute: 0 10*3/uL (ref 0.0–0.7)
Eosinophils Relative: 0.7 % (ref 0.0–5.0)
HCT: 46.1 % (ref 39.0–52.0)
Hemoglobin: 15.2 g/dL (ref 13.0–17.0)
Lymphocytes Relative: 24.8 % (ref 12.0–46.0)
Lymphs Abs: 1.8 10*3/uL (ref 0.7–4.0)
MCHC: 33 g/dL (ref 30.0–36.0)
MCV: 85.3 fL (ref 78.0–100.0)
Monocytes Absolute: 0.5 10*3/uL (ref 0.1–1.0)
Monocytes Relative: 6.6 % (ref 3.0–12.0)
Neutro Abs: 4.9 10*3/uL (ref 1.4–7.7)
Neutrophils Relative %: 67.6 % (ref 43.0–77.0)
Platelets: 204 10*3/uL (ref 150.0–400.0)
RBC: 5.4 Mil/uL (ref 4.22–5.81)
RDW: 13.5 % (ref 11.5–15.5)
WBC: 7.2 10*3/uL (ref 4.0–10.5)

## 2024-01-05 LAB — LIPID PANEL
Cholesterol: 186 mg/dL (ref 0–200)
HDL: 59.1 mg/dL (ref >39.00)
LDL Cholesterol: 115 mg/dL — ABNORMAL HIGH (ref 0–99)
NonHDL: 127.03
Total CHOL/HDL Ratio: 3
Triglycerides: 62 mg/dL (ref 0.0–149.0)
VLDL: 12.4 mg/dL (ref 0.0–40.0)

## 2024-01-05 LAB — HEPATIC FUNCTION PANEL
ALT: 11 U/L (ref 0–53)
AST: 17 U/L (ref 0–37)
Albumin: 4.9 g/dL (ref 3.5–5.2)
Alkaline Phosphatase: 52 U/L (ref 39–117)
Bilirubin, Direct: 0.1 mg/dL (ref 0.0–0.3)
Total Bilirubin: 0.8 mg/dL (ref 0.2–1.2)
Total Protein: 7.1 g/dL (ref 6.0–8.3)

## 2024-01-05 LAB — BASIC METABOLIC PANEL
BUN: 16 mg/dL (ref 6–23)
CO2: 31 meq/L (ref 19–32)
Calcium: 9.6 mg/dL (ref 8.4–10.5)
Chloride: 99 meq/L (ref 96–112)
Creatinine, Ser: 1.13 mg/dL (ref 0.40–1.50)
GFR: 79.9 mL/min (ref >60.00)
Glucose, Bld: 89 mg/dL (ref 70–99)
Potassium: 4.3 meq/L (ref 3.5–5.1)
Sodium: 138 meq/L (ref 135–145)

## 2024-01-05 LAB — PSA: PSA: 0.47 ng/mL (ref 0.10–4.00)
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
## 2024-06-15 ENCOUNTER — Encounter: Payer: Self-pay | Admitting: Family Medicine

## 2024-09-13 ENCOUNTER — Ambulatory Visit: Admitting: Podiatry

## 2024-09-15 ENCOUNTER — Ambulatory Visit: Admitting: Podiatry

## 2024-09-15 DIAGNOSIS — M216X2 Other acquired deformities of left foot: Secondary | ICD-10-CM | POA: Diagnosis not present

## 2024-09-15 DIAGNOSIS — Q666 Other congenital valgus deformities of feet: Secondary | ICD-10-CM | POA: Diagnosis not present

## 2024-09-15 DIAGNOSIS — M216X1 Other acquired deformities of right foot: Secondary | ICD-10-CM | POA: Diagnosis not present

## 2024-09-15 NOTE — Progress Notes (Unsigned)
  Subjective:  Patient ID: Charles Mcknight, male    DOB: 12/16/1980,  MRN: [id number]  Chief Complaint  Patient presents with   Callouses    Pt stated that he has a few calluses that give him some slight discomfort     43 y.o. male presents with the above complaint.  Patient presents with bilateral plantarflexed fifth metatarsal with submetatarsal 5 callus.  He states is causing him a lot of discomfort denies any other acute complaints would like to discuss treatment options for this.  He does not wear any orthotics I discussed shoe gear modification as well.  Pain scale is 5 out of 10 hurts with ambulation hurts with pressure   Review of Systems: Negative except as noted in the HPI. Denies N/V/F/Ch.  No past medical history on file. No current outpatient medications on file.  Social History   Tobacco Use  Smoking Status Former   Types: Cigars  Smokeless Tobacco Never  Tobacco Comments   cigar socially    Allergies  Allergen Reactions   Iodine    Objective:  There were no vitals filed for this visit. There is no height or weight on file to calculate BMI. Constitutional Well developed. Well nourished.  Vascular Dorsalis pedis pulses palpable bilaterally. Posterior tibial pulses palpable bilaterally. Capillary refill normal to all digits.  No cyanosis or clubbing noted. Pedal hair growth normal.  Neurologic Normal speech. Oriented to person, place, and time. Epicritic sensation to light touch grossly present bilaterally.  Dermatologic Bilateral submetatarsal 5 skin lesion with underlying plantarflexed metatarsal noted.  Pain on palpation to the skin lesion no open wounds noted  Orthopedic: Normal joint ROM without pain or crepitus bilaterally. No visible deformities. No bony tenderness.   Radiographs: None Assessment:   1. Plantar flexed metatarsal bone of right foot   2. Plantar flexed metatarsal bone of left foot   3. Pes planovalgus    Plan:  Patient was evaluated  and treated and all questions answered.  Bilateral plantarflexed fifth metatarsal with submetatarsal 5 skin lesion - All questions and concerns were discussed with the patient in extensive detail using chisel blade handle the lesions were debrided down to healthy stripe tissue no complication noted no pinpoint bleeding noted.  I discussed shoe gear modification as well as orthotics management patient agrees with plan elected proceed with orthotics  Pes planovalgus/foot deformity -I explained to patient the etiology of pes planovalgus and relationship with heel pain/arch pain and various treatment options were discussed.  Given patient foot structure in the setting of heel pain/arch pain I believe patient will benefit from custom-made orthotics to help control the hindfoot motion support the arch of the foot and take the stress away from arches.  Patient agrees with the plan like to proceed with orthotics -Patient was casted for orthotics with bilateral fifth metatarsal offloading   No follow-ups on file.

## 2024-11-07 ENCOUNTER — Telehealth: Payer: Self-pay

## 2024-11-07 NOTE — Telephone Encounter (Signed)
 Called patient to schedule an appointment to come in and PUO. Unable to reach patient LVM

## 2024-12-11 ENCOUNTER — Telehealth: Payer: Self-pay | Admitting: *Deleted

## 2024-12-11 NOTE — Telephone Encounter (Signed)
 Patient presents to pick up orthotics. Toya dispensed and checked fit. Instructions given.

## 2025-01-05 ENCOUNTER — Ambulatory Visit: Payer: Self-pay | Admitting: Family Medicine

## 2025-01-05 ENCOUNTER — Ambulatory Visit: Admitting: Family Medicine

## 2025-01-05 ENCOUNTER — Encounter: Payer: Self-pay | Admitting: Family Medicine

## 2025-01-05 VITALS — BP 114/80 | HR 76 | Temp 98.4°F | Ht 78.0 in | Wt 217.2 lb

## 2025-01-05 DIAGNOSIS — Z0000 Encounter for general adult medical examination without abnormal findings: Secondary | ICD-10-CM

## 2025-01-05 LAB — HEPATIC FUNCTION PANEL
ALT: 11 U/L (ref 3–53)
AST: 16 U/L (ref 5–37)
Albumin: 4.9 g/dL (ref 3.5–5.2)
Alkaline Phosphatase: 47 U/L (ref 39–117)
Bilirubin, Direct: 0.2 mg/dL (ref 0.1–0.3)
Total Bilirubin: 0.9 mg/dL (ref 0.2–1.2)
Total Protein: 7.1 g/dL (ref 6.0–8.3)

## 2025-01-05 LAB — CBC WITH DIFFERENTIAL/PLATELET
Basophils Absolute: 0 10*3/uL (ref 0.0–0.1)
Basophils Relative: 0.5 % (ref 0.0–3.0)
Eosinophils Absolute: 0 10*3/uL (ref 0.0–0.7)
Eosinophils Relative: 0.8 % (ref 0.0–5.0)
HCT: 44.9 % (ref 39.0–52.0)
Hemoglobin: 15.1 g/dL (ref 13.0–17.0)
Lymphocytes Relative: 34 % (ref 12.0–46.0)
Lymphs Abs: 1.9 10*3/uL (ref 0.7–4.0)
MCHC: 33.7 g/dL (ref 30.0–36.0)
MCV: 84 fl (ref 78.0–100.0)
Monocytes Absolute: 0.5 10*3/uL (ref 0.1–1.0)
Monocytes Relative: 9.1 % (ref 3.0–12.0)
Neutro Abs: 3.1 10*3/uL (ref 1.4–7.7)
Neutrophils Relative %: 55.6 % (ref 43.0–77.0)
Platelets: 187 10*3/uL (ref 150.0–400.0)
RBC: 5.35 Mil/uL (ref 4.22–5.81)
RDW: 13.6 % (ref 11.5–15.5)
WBC: 5.5 10*3/uL (ref 4.0–10.5)

## 2025-01-05 LAB — BASIC METABOLIC PANEL WITH GFR
BUN: 18 mg/dL (ref 6–23)
CO2: 31 meq/L (ref 19–32)
Calcium: 9.6 mg/dL (ref 8.4–10.5)
Chloride: 101 meq/L (ref 96–112)
Creatinine, Ser: 1.2 mg/dL (ref 0.40–1.50)
GFR: 73.82 mL/min
Glucose, Bld: 88 mg/dL (ref 70–99)
Potassium: 4.4 meq/L (ref 3.5–5.1)
Sodium: 138 meq/L (ref 135–145)

## 2025-01-05 LAB — LIPID PANEL
Cholesterol: 181 mg/dL (ref 28–200)
HDL: 56 mg/dL
LDL Cholesterol: 107 mg/dL — ABNORMAL HIGH (ref 10–99)
NonHDL: 125.19
Total CHOL/HDL Ratio: 3
Triglycerides: 90 mg/dL (ref 10.0–149.0)
VLDL: 18 mg/dL (ref 0.0–40.0)

## 2025-01-05 LAB — TSH: TSH: 2.11 u[IU]/mL (ref 0.35–5.50)

## 2025-01-05 LAB — PSA: PSA: 0.63 ng/mL (ref 0.10–4.00)

## 2025-01-05 NOTE — Progress Notes (Signed)
 "  Established Patient Office Visit  Subjective   Patient ID: Charles Mcknight, male    DOB: 1981/03/23  Age: 44 y.o. MRN: [id number]  Chief Complaint  Patient presents with   Annual Exam    HPI   Charles Mcknight is seen today for annual well visit/physical.  He is very health-conscious with regard to diet and exercise.  Exercise usually about 4 days/week.  Has focus more on strength training this past year.  He has no specific complaints today.  He thinks he had tetanus booster within the last 5 years but we do not have a record of that.  Other immunizations up-to-date.  He takes no regular medications.  Never smoked.  No alcohol.  Health maintenance reviewed:  Health Maintenance  Topic Date Due   HIV Screening  Never done   Hepatitis B Vaccines 19-59 Average Risk (1 of 3 - 19+ 3-dose series) Never done   DTaP/Tdap/Td (3 - Td or Tdap) 12/01/2023   COVID-19 Vaccine (3 - 2025-26 season) 07/31/2024   Influenza Vaccine  Completed   HPV VACCINES (No Doses Required) Completed   Hepatitis C Screening  Completed   Meningococcal B Vaccine  Aged Out   Pneumococcal Vaccine  Discontinued   Social history-Works for [employer].  Never smoked.  Very infrequent alcohol.  He is married and has 3 children of his own and helping raise 25 and 41-year-old nephew and niece  Family history father had history of HIV.  Both his parents had alcohol and drug problems.  They are both deceased.  Exact cause of death unknown.  He knows that his mother had multiorgan failure.  Half brother with history of alcoholism.  History reviewed. No pertinent past medical history. History reviewed. No pertinent surgical history.  reports that he has quit smoking. His smoking use included cigars. He has never used smokeless tobacco. He reports that he does not drink alcohol and does not use drugs. family history includes Alcohol abuse in his father and mother; Drug abuse in his father and mother; Early death in his  father and mother. Allergies[1]   Review of Systems  Constitutional:  Negative for chills, fever, malaise/fatigue and weight loss.  HENT:  Negative for hearing loss.   Eyes:  Negative for blurred vision and double vision.  Respiratory:  Negative for cough and shortness of breath.   Cardiovascular:  Negative for chest pain, palpitations and leg swelling.  Gastrointestinal:  Negative for abdominal pain, blood in stool, constipation and diarrhea.  Genitourinary:  Negative for dysuria.  Skin:  Negative for rash.  Neurological:  Negative for dizziness, speech change, seizures, loss of consciousness, weakness and headaches.  Psychiatric/Behavioral:  Negative for depression.       Objective:     BP 114/80   Pulse 76   Temp 98.4 F (36.9 C) (Oral)   Ht 6' 6 (1.981 m)   Wt 217 lb 3.2 oz (98.5 kg)   SpO2 97%   BMI 25.10 kg/m  BP Readings from Last 3 Encounters:  01/05/25 114/80  01/05/24 130/80  03/26/23 118/78   Wt Readings from Last 3 Encounters:  01/05/25 217 lb 3.2 oz (98.5 kg)  01/05/24 201 lb (91.2 kg)  03/26/23 208 lb (94.3 kg)      Physical Exam Vitals reviewed.  Constitutional:      General: He is not in acute distress.    Appearance: He is well-developed. He is not ill-appearing.  HENT:     Right Ear: External ear  normal.     Left Ear: External ear normal.  Eyes:     Pupils: Pupils are equal, round, and reactive to light.  Neck:     Thyroid : No thyromegaly.  Cardiovascular:     Rate and Rhythm: Normal rate and regular rhythm.  Pulmonary:     Effort: Pulmonary effort is normal. No respiratory distress.     Breath sounds: Normal breath sounds. No wheezing or rales.  Musculoskeletal:     Cervical back: Neck supple.     Right lower leg: No edema.     Left lower leg: No edema.  Neurological:     Mental Status: He is alert and oriented to person, place, and time.      No results found for any visits on 01/05/25.    The 10-year ASCVD risk score  (Arnett DK, et al., 2019) is: 2.8%    Assessment & Plan:   Problem List Items Addressed This Visit   None Visit Diagnoses       Physical exam    -  Primary   Relevant Orders   Basic metabolic panel with GFR   Lipid panel   CBC with Differential/Platelet   TSH   Hepatic function panel   PSA     Health-conscious 44 year old male here today for well visit.  He is diligent with regard to eating healthy diet and exercises regularly.  No specific complaints today.  We discussed the following health maintenance items  -Continue regular exercise habits - Continue annual flu vaccine - Confirm date of last tetanus and needs booster if less than 10 years though he thinks this was less than 5 years ago - Initial screening colonoscopy in 1 year  No follow-ups on file.    Wolm Scarlet, MD     [1]  Allergies Allergen Reactions   Iodine    "
